# Patient Record
Sex: Male | Born: 1960 | Race: Black or African American | Hispanic: No | Marital: Married | Smoking: Never smoker
Health system: Southern US, Community
[De-identification: ages and names within clinical notes are randomized; demographics above are authoritative.]

---

## 2007-10-05 ENCOUNTER — Emergency Department (HOSPITAL_COMMUNITY): Admission: EM | Admit: 2007-10-05 | Discharge: 2007-10-05 | Payer: Self-pay | Admitting: Emergency Medicine

## 2016-03-14 ENCOUNTER — Emergency Department
Admission: EM | Admit: 2016-03-14 | Discharge: 2016-03-14 | Disposition: A | Discharge status: Home / Self Care | Payer: Self-pay | Attending: Emergency Medicine | Admitting: Emergency Medicine

## 2016-03-14 ENCOUNTER — Encounter: Payer: Self-pay | Admitting: Emergency Medicine

## 2016-03-14 ENCOUNTER — Emergency Department: Payer: Self-pay

## 2016-03-14 DIAGNOSIS — M1712 Unilateral primary osteoarthritis, left knee: Secondary | ICD-10-CM | POA: Insufficient documentation

## 2016-03-14 MED ORDER — MELOXICAM 15 MG PO TABS: 15.0000 mg | ORAL_TABLET | Freq: Every day | ORAL | 0 refills | Status: AC

## 2016-03-14 NOTE — ED Notes (Signed)
See triage note  Developed left knee pain about 2 months ago   Unsure of injury but does carry heavy items daily   No deformity noted positive swelling noted   Ambulates with slight limp

## 2016-03-14 NOTE — Discharge Instructions (Signed)
Been taking meloxicam once a day every day with food. Follow-up with Dr. Martha ClanKrasinski if any continued knee pain.

## 2016-03-14 NOTE — ED Provider Notes (Signed)
[facility] Emergency Department Provider Note  ____________________________________________   First MD Initiated Contact with Patient 03/14/16 1146     (approximate)  I have reviewed the triage vital signs and the nursing notes.   HISTORY  Chief Complaint Knee Pain   HPI Scott Warner is a 56 y.o. male presents to the emergency room with complaint of left knee pain for approximately 2 months. Patient denies any injury to his knee. He states that often and he has had some swelling to his left knee and occasionally becomes very painful. He has occasionally taken over-the-counter medication with some relief. 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 This is first time he has been seen for his knee. Currently he rates his pain as a 4 out of 10.   History reviewed. No pertinent past medical history.  There are no active problems to display for this patient.   History reviewed. No pertinent surgical history.  Prior to Admission medications   Medication Sig Start Date End Date Taking? Authorizing Provider  meloxicam (MOBIC) 15 MG tablet Take 1 tablet (15 mg total) by mouth daily. 03/14/16 03/14/17  Tommi Rumpshonda L Teyona Nichelson, PA-C    Allergies Patient has no known allergies.  No family history on file.  Social History Social History  Substance Use Topics  . Smoking status: Never Smoker  . Smokeless tobacco: Never Used  . Alcohol use Yes     Comment: rarely    Review of Systems Constitutional: No fever/chills Cardiovascular: Denies chest pain. Respiratory: Denies shortness of breath. Gastrointestinal: No abdominal pain.  No nausea, no vomiting.  Musculoskeletal: Positive left knee pain. Skin: Negative for rash. Neurological: Negative for headaches, focal weakness or numbness.  10-point ROS otherwise negative.  ____________________________________________   PHYSICAL EXAM:  VITAL  SIGNS: ED Triage Vitals  Enc Vitals Group     BP 03/14/16 1127 135/82     Pulse Rate 03/14/16 1127 75     Resp 03/14/16 1127 18     Temp 03/14/16 1127 98.4 F (36.9 C)     Temp Source 03/14/16 1127 Oral     SpO2 03/14/16 1127 98 %     Weight 03/14/16 1128 300 lb (136.1 kg)     Height 03/14/16 1128 6\' 4"  (1.93 m)     Head Circumference --      Peak Flow --      Pain Score 03/14/16 1128 4     Pain Loc --      Pain Edu? --      Excl. in GC? --     Constitutional: Alert and oriented. Well appearing and in no acute distress. Eyes: Conjunctivae are normal. PERRL. EOMI. Head: Atraumatic. Nose: No congestion/rhinnorhea. Neck: No stridor.   Cardiovascular: Normal rate, regular rhythm. Grossly normal heart sounds.  Good peripheral circulation. Respiratory: Normal respiratory effort.  No retractions. Lungs CTAB. Musculoskeletal: On examination of the left knee there is no gross deformity noted. There is crepitus with range of motion. There is no effusion present. There is no warmth or redness noted. Patient is able to ambulate without assistance. He moves are stable. Neurologic:  Normal speech and language. No gross focal neurologic deficits are appreciated. No gait instability. Skin:  Skin is warm, dry and intact. No rash noted. Psychiatric: Mood and affect are normal. Speech and behavior are normal.  ____________________________________________   LABS (all labs ordered are listed, but only  abnormal results are displayed)  Labs Reviewed - No data to display  RADIOLOGY Left knee x-ray per radiologist: IMPRESSION:  Tricompartmental degenerative changes. No acute abnormality.   I, Tommi Rumps, personally viewed and evaluated these images (plain radiographs) as part of my medical decision making, as well as reviewing the written report by the radiologist.  ____________________________________________   PROCEDURES  Procedure(s) performed: None  Procedures  Critical Care  performed: No  ____________________________________________   INITIAL IMPRESSION / ASSESSMENT AND PLAN / ED COURSE  Pertinent labs & imaging results that were available during my care of the patient were reviewed by me and considered in my medical decision making (see chart for details).    Clinical Course    X-ray findings were discussed with patient and he was given a copy of 1 x-ray to help explain his knee problems to him. He was given a prescription for meloxicam 15 mg one daily with food. Patient is follow-up with Dr. Martha Clan if any continued problems with his knee or continued concerns.  ____________________________________________   FINAL CLINICAL IMPRESSION(S) / ED DIAGNOSES  Final diagnoses:  Osteoarthritis of left knee, unspecified osteoarthritis type      NEW MEDICATIONS STARTED DURING THIS VISIT:  Discharge Medication List as of 03/14/2016 12:43 PM    START taking these medications   Details  meloxicam (MOBIC) 15 MG tablet Take 1 tablet (15 mg total) by mouth daily., Starting Sat 03/14/2016, Until Sun 03/14/2017, Print         Note:  This document was prepared using Dragon voice recognition software and may include unintentional dictation errors.    Tommi Rumps, PA-C 03/14/16 1551    Jeanmarie Plant, MD 03/16/16 [phone]

## 2016-03-14 NOTE — ED Triage Notes (Signed)
Patient presents to the ED with left knee pain x 2 months with swelling to left knee as well.  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

## 2019-05-11 ENCOUNTER — Emergency Department (HOSPITAL_COMMUNITY): Payer: HRSA Program

## 2019-05-11 ENCOUNTER — Encounter (HOSPITAL_COMMUNITY): Payer: Self-pay | Admitting: Pediatrics

## 2019-05-11 ENCOUNTER — Emergency Department (HOSPITAL_COMMUNITY)
Admission: EM | Admit: 2019-05-11 | Discharge: 2019-05-12 | Disposition: A | Discharge status: Home / Self Care | Payer: HRSA Program | Attending: Emergency Medicine | Admitting: Emergency Medicine

## 2019-05-11 ENCOUNTER — Other Ambulatory Visit: Payer: Self-pay

## 2019-05-11 DIAGNOSIS — J181 Lobar pneumonia, unspecified organism: Secondary | ICD-10-CM | POA: Diagnosis not present

## 2019-05-11 DIAGNOSIS — Z20822 Contact with and (suspected) exposure to covid-19: Secondary | ICD-10-CM

## 2019-05-11 DIAGNOSIS — U071 COVID-19: Secondary | ICD-10-CM | POA: Diagnosis not present

## 2019-05-11 DIAGNOSIS — R197 Diarrhea, unspecified: Secondary | ICD-10-CM

## 2019-05-11 DIAGNOSIS — J189 Pneumonia, unspecified organism: Secondary | ICD-10-CM

## 2019-05-11 LAB — POC SARS CORONAVIRUS 2 AG -  ED: SARS Coronavirus 2 Ag: NEGATIVE

## 2019-05-11 LAB — CBC
HCT: 43.4 % (ref 39.0–52.0)
Hemoglobin: 15.5 g/dL (ref 13.0–17.0)
MCH: 29.1 pg (ref 26.0–34.0)
MCHC: 35.7 g/dL (ref 30.0–36.0)
MCV: 81.4 fL (ref 80.0–100.0)
Platelets: 239 10*3/uL (ref 150–400)
RBC: 5.33 MIL/uL (ref 4.22–5.81)
RDW: 13.8 % (ref 11.5–15.5)
WBC: 6.4 10*3/uL (ref 4.0–10.5)
nRBC: 0 % (ref 0.0–0.2)

## 2019-05-11 LAB — COMPREHENSIVE METABOLIC PANEL
ALT: 30 U/L (ref 0–44)
AST: 43 U/L — ABNORMAL HIGH (ref 15–41)
Albumin: 3.5 g/dL (ref 3.5–5.0)
Alkaline Phosphatase: 67 U/L (ref 38–126)
Anion gap: 13 (ref 5–15)
BUN: 19 mg/dL (ref 6–20)
CO2: 20 mmol/L — ABNORMAL LOW (ref 22–32)
Calcium: 8.9 mg/dL (ref 8.9–10.3)
Chloride: 103 mmol/L (ref 98–111)
Creatinine, Ser: 1.41 mg/dL — ABNORMAL HIGH (ref 0.61–1.24)
GFR calc Af Amer: >60 mL/min (ref >60)
GFR calc non Af Amer: 54 mL/min — ABNORMAL LOW (ref >60)
Glucose, Bld: 108 mg/dL — ABNORMAL HIGH (ref 70–99)
Potassium: 4.2 mmol/L (ref 3.5–5.1)
Sodium: 136 mmol/L (ref 135–145)
Total Bilirubin: 1.5 mg/dL — ABNORMAL HIGH (ref 0.3–1.2)
Total Protein: 7.7 g/dL (ref 6.5–8.1)

## 2019-05-11 LAB — URINALYSIS, ROUTINE W REFLEX MICROSCOPIC
Bilirubin Urine: NEGATIVE
Glucose, UA: NEGATIVE mg/dL
Hgb urine dipstick: NEGATIVE
Ketones, ur: NEGATIVE mg/dL
Leukocytes,Ua: NEGATIVE
Nitrite: NEGATIVE
Protein, ur: 30 mg/dL — AB
Specific Gravity, Urine: 1.026 (ref 1.005–1.030)
pH: 6 (ref 5.0–8.0)

## 2019-05-11 LAB — LIPASE, BLOOD: Lipase: 31 U/L (ref 11–51)

## 2019-05-11 MED ORDER — SODIUM CHLORIDE 0.9 % IV BOLUS
1000.0000 mL | Freq: Once | INTRAVENOUS | Status: AC
  Administered 2019-05-11: 23:00:00 1000 mL via INTRAVENOUS

## 2019-05-11 MED ORDER — DOXYCYCLINE HYCLATE 100 MG PO CAPS: 100.0000 mg | ORAL_CAPSULE | Freq: Two times a day (BID) | ORAL | 0 refills | Status: AC

## 2019-05-11 MED ORDER — SODIUM CHLORIDE 0.9% FLUSH
3.0000 mL | Freq: Once | INTRAVENOUS | Status: AC
  Administered 2019-05-11: 23:00:00 3 mL via INTRAVENOUS

## 2019-05-11 MED ORDER — ONDANSETRON 4 MG PO TBDP: 4.0000 mg | ORAL_TABLET | Freq: Three times a day (TID) | ORAL | 0 refills | Status: AC | PRN

## 2019-05-11 MED ORDER — ACETAMINOPHEN 325 MG PO TABS
650.0000 mg | ORAL_TABLET | Freq: Once | ORAL | Status: AC
  Administered 2019-05-11: 19:00:00 650 mg via ORAL
  Filled 2019-05-11: qty 2

## 2019-05-11 NOTE — Discharge Instructions (Addendum)
Your rapid Covid test was negative.  However we will need to get another test to verify these results. You do have evidence of pneumonia on your chest x-ray. We will treat this with an antibiotic. Take the Zofran to help with nausea. Follow-up with your primary care provider. Return to the ER if you start to experience chest pain, shortness of breath, increased abdominal pain, bloody stools.

## 2019-05-11 NOTE — ED Notes (Signed)
Ester Mabe son [phone] call with updates

## 2019-05-11 NOTE — ED Provider Notes (Signed)
[facility] EMERGENCY DEPARTMENT Provider Note   CSN: [id number] Arrival date & time: 05/11/19  1849     History Chief Complaint  Patient presents with  . Diarrhea    Scott Warner is a 59 y.o. male who presents to ED with a chief complaint of diarrhea.  Since 05/08/2019 has been having 2-3 episodes of watery diarrhea daily.  Denies any bloody diarrhea.  Has been noticing chills and a slight productive cough with clear mucus as well.  No sick contacts with similar symptoms.  Denies any suspicious food ingestions.  Has been able to tolerate liquids without vomiting.  Reports generalized cramping abdominal pain from the diarrhea but denies any focal pain.  Denies any urinary symptoms, has not been taking any medications to help with his symptoms.  Denies any shortness of breath, cough, back pain, hemoptysis, leg swelling.  HPI     History reviewed. No pertinent past medical history.  There are no problems to display for this patient.   History reviewed. No pertinent surgical history.     No family history on file.  Social History   Tobacco Use  . Smoking status: Never Smoker  . Smokeless tobacco: Never Used  Substance Use Topics  . Alcohol use: Yes    Comment: rarely  . Drug use: Not on file    Home Medications Prior to Admission medications   Medication Sig Start Date End Date Taking? Authorizing Provider  doxycycline (VIBRAMYCIN) 100 MG capsule Take 1 capsule (100 mg total) by mouth 2 (two) times daily. 05/11/19   Alesi Zachery, PA-C  ondansetron (ZOFRAN ODT) 4 MG disintegrating tablet Take 1 tablet (4 mg total) by mouth every 8 (eight) hours as needed for nausea or vomiting. 05/11/19   Dietrich Pates, PA-C    Allergies    Patient has no known allergies.  Review of Systems   Review of Systems  Constitutional: Positive for chills. Negative for appetite change and fever.  HENT: Negative for ear pain, rhinorrhea, sneezing and sore throat.   Eyes:  Negative for photophobia and visual disturbance.  Respiratory: Positive for cough. Negative for chest tightness, shortness of breath and wheezing.   Cardiovascular: Negative for chest pain and palpitations.  Gastrointestinal: Positive for diarrhea. Negative for abdominal pain, blood in stool, constipation, nausea and vomiting.  Genitourinary: Negative for dysuria, hematuria and urgency.  Musculoskeletal: Negative for myalgias.  Skin: Negative for rash.  Neurological: Negative for dizziness, weakness and light-headedness.    Physical Exam Updated Vital Signs BP (!) 147/75   Pulse 80   Temp 99.1 F (37.3 C) (Oral)   Resp 16   Ht 6\' 4"  (1.93 m)   Wt 124.7 kg   SpO2 92%   BMI 33.47 kg/m   Physical Exam Vitals and nursing note reviewed.  Constitutional:      General: He is not in acute distress.    Appearance: He is well-developed.  HENT:     Head: Normocephalic and atraumatic.     Nose: Nose normal.  Eyes:     General: No scleral icterus.       Right eye: No discharge.        Left eye: No discharge.     Conjunctiva/sclera: Conjunctivae normal.  Cardiovascular:     Rate and Rhythm: Normal rate and regular rhythm.     Heart sounds: Normal heart sounds. No murmur. No friction rub. No gallop.   Pulmonary:     Effort: Pulmonary effort is normal. No  respiratory distress.     Breath sounds: Normal breath sounds.  Abdominal:     General: Bowel sounds are normal. There is no distension.     Palpations: Abdomen is soft.     Tenderness: There is no abdominal tenderness. There is no guarding.     Comments: Nontender.  Musculoskeletal:        General: Normal range of motion.     Cervical back: Normal range of motion and neck supple.  Skin:    General: Skin is warm and dry.     Findings: No rash.  Neurological:     Mental Status: He is alert.     Motor: No abnormal muscle tone.     Coordination: Coordination normal.     ED Results / Procedures / Treatments   Labs (all labs  ordered are listed, but only abnormal results are displayed) Labs Reviewed  COMPREHENSIVE METABOLIC PANEL - Abnormal; Notable for the following components:      Result Value   CO2 20 (*)    Glucose, Bld 108 (*)    Creatinine, Ser 1.41 (*)    AST 43 (*)    Total Bilirubin 1.5 (*)    GFR calc non Af Amer 54 (*)    All other components within normal limits  URINALYSIS, ROUTINE W REFLEX MICROSCOPIC - Abnormal; Notable for the following components:   Color, Urine AMBER (*)    Protein, ur 30 (*)    Bacteria, UA RARE (*)    All other components within normal limits  SARS CORONAVIRUS 2 (TAT 6-24 HRS)  LIPASE, BLOOD  CBC  POC SARS CORONAVIRUS 2 AG -  ED    EKG None  Radiology DG Chest Portable 1 View  Result Date: 05/11/2019 CLINICAL DATA:  Fever and cough EXAM: PORTABLE CHEST 1 VIEW COMPARISON:  None. FINDINGS: Hazy opacity right base. No pleural effusion. Normal heart size. No pneumothorax IMPRESSION: Hazy right basilar atelectasis or mild pneumonia. Electronically Signed   By: Donavan Foil M.D.   On: 05/11/2019 22:53    Procedures Procedures (including critical care time)  Medications Ordered in ED Medications  sodium chloride flush (NS) 0.9 % injection 3 mL (3 mLs Intravenous Given 05/11/19 2251)  acetaminophen (TYLENOL) tablet 650 mg (650 mg Oral Given 05/11/19 1906)  sodium chloride 0.9 % bolus 1,000 mL (1,000 mLs Intravenous New Bag/Given 05/11/19 2251)    ED Course  I have reviewed the triage vital signs and the nursing notes.  Pertinent labs & imaging results that were available during my care of the patient were reviewed by me and considered in my medical decision making (see chart for details).  Clinical Course as of May 11 2346  Thu May 11, 2019  2241 Recheck temperature is 101.1 F.   [HK]  2241 Creatinine(!): 1.41 [HK]  2332 Temp: 99.1 F (37.3 C) [HK]    Clinical Course User Index [HK] Delia Heady, PA-C   MDM Rules/Calculators/A&P                       Scott Warner was evaluated in Emergency Department on 05/11/19 for the symptoms described in the history of present illness. He/she was evaluated in the context of the global COVID-19 pandemic, which necessitated consideration that the patient might be at risk for infection with the SARS-CoV-2 virus that causes COVID-19. Institutional protocols and algorithms that pertain to the evaluation of patients at risk for COVID-19 are in a state of rapid change  based on information released by regulatory bodies including the CDC and federal and state organizations. These policies and algorithms were followed during the patient's care in the ED.  59 year old male presenting to the ED with a chief complaint of diarrhea.  Patient found to be febrile to 103 here.  Reports diarrhea for the past 4 days as well as a productive cough.  No known COVID-19 exposures or sick contacts with similar symptoms.  Denies any abdominal pain but does report generalized cramping sensation with having diarrhea.  On exam abdomen is soft, nontender nondistended.  Temperature improved with Tylenol given in triage.  He is not tachycardic, tachypneic, oxygen saturations above 92 to 93% on room air.  He does not appear to be in respiratory distress.  Work-up here significant for creatinine of 1.4, no priors.  CBC unremarkable, normal white blood cell count.  Lipase is unremarkable.  Chest x-ray shows possible right-sided pneumonia.  POC COVID test negative.  Will obtain PCR test.  Discussed with patient regarding his lab work and vital signs today.  We will treat him for CAP and will give Zofran to help with symptoms.  Patient is comfortable with discharge home and he does not feel that he needs admission at this time.  I did ask him to increase his p.o. hydration to help with his symptoms as well.   Feel that his fever today could be secondary to either Covid, his pneumonia or his viral GI illness, I doubt appendicitis, cholecystitis or other  emergent cause.  Patient given strict return precautions.  Patient is hemodynamically stable, in NAD, and able to ambulate in the ED. Evaluation does not show pathology that would require ongoing emergent intervention or inpatient treatment. I have personally reviewed and interpreted all lab work and imaging at today's ED visit. I explained the diagnosis to the patient. Pain has been managed and has no complaints prior to discharge. Patient is comfortable with above plan and is stable for discharge at this time. All questions were answered prior to disposition. Strict return precautions for returning to the ED were discussed. Encouraged follow up with PCP.   An After Visit Summary was printed and given to the patient.   Portions of this note were generated with Scientist, clinical (histocompatibility and immunogenetics). Dictation errors may occur despite best attempts at proofreading.   Final Clinical Impression(s) / ED Diagnoses Final diagnoses:  Diarrhea, unspecified type  Suspected COVID-19 virus infection  Community acquired pneumonia of right lung, unspecified part of lung    Rx / DC Orders ED Discharge Orders         Ordered    doxycycline (VIBRAMYCIN) 100 MG capsule  2 times daily     05/11/19 2343    ondansetron (ZOFRAN ODT) 4 MG disintegrating tablet  Every 8 hours PRN     05/11/19 2343           Dietrich Pates, PA-C 05/11/19 2348    Milagros Loll, MD 05/16/19 2356

## 2019-05-11 NOTE — ED Triage Notes (Signed)
Came in via EMS from home; c/o diarrhea since Monday.

## 2019-05-12 ENCOUNTER — Telehealth (HOSPITAL_COMMUNITY): Payer: Self-pay

## 2019-05-12 LAB — SARS CORONAVIRUS 2 (TAT 6-24 HRS): SARS Coronavirus 2: POSITIVE — AB

## 2019-05-12 NOTE — ED Notes (Signed)
Patient verbalizes understanding of discharge instructions. Opportunity for questioning and answers were provided. Armband removed by staff, pt discharged from ED in wheelchair to home.   

## 2019-05-12 NOTE — ED Notes (Signed)
This RN called a cab for this pt

## 2020-09-12 ENCOUNTER — Emergency Department (HOSPITAL_COMMUNITY): Payer: No Typology Code available for payment source

## 2020-09-12 ENCOUNTER — Emergency Department (HOSPITAL_COMMUNITY)
Admission: EM | Admit: 2020-09-12 | Discharge: 2020-09-12 | Disposition: A | Discharge status: Home / Self Care | Payer: No Typology Code available for payment source | Attending: Emergency Medicine | Admitting: Emergency Medicine

## 2020-09-12 ENCOUNTER — Encounter (HOSPITAL_COMMUNITY): Payer: Self-pay | Admitting: *Deleted

## 2020-09-12 ENCOUNTER — Other Ambulatory Visit: Payer: Self-pay

## 2020-09-12 DIAGNOSIS — R42 Dizziness and giddiness: Secondary | ICD-10-CM | POA: Insufficient documentation

## 2020-09-12 DIAGNOSIS — M25832 Other specified joint disorders, left wrist: Secondary | ICD-10-CM

## 2020-09-12 DIAGNOSIS — M25512 Pain in left shoulder: Secondary | ICD-10-CM | POA: Diagnosis not present

## 2020-09-12 DIAGNOSIS — R519 Headache, unspecified: Secondary | ICD-10-CM | POA: Diagnosis not present

## 2020-09-12 DIAGNOSIS — Y92410 Unspecified street and highway as the place of occurrence of the external cause: Secondary | ICD-10-CM | POA: Diagnosis not present

## 2020-09-12 DIAGNOSIS — M1990 Unspecified osteoarthritis, unspecified site: Secondary | ICD-10-CM | POA: Diagnosis not present

## 2020-09-12 DIAGNOSIS — M24832 Other specific joint derangements of left wrist, not elsewhere classified: Secondary | ICD-10-CM | POA: Diagnosis not present

## 2020-09-12 DIAGNOSIS — M25562 Pain in left knee: Secondary | ICD-10-CM | POA: Insufficient documentation

## 2020-09-12 DIAGNOSIS — M7918 Myalgia, other site: Secondary | ICD-10-CM

## 2020-09-12 DIAGNOSIS — M25561 Pain in right knee: Secondary | ICD-10-CM | POA: Diagnosis not present

## 2020-09-12 DIAGNOSIS — M25532 Pain in left wrist: Secondary | ICD-10-CM | POA: Diagnosis present

## 2020-09-12 DIAGNOSIS — M545 Low back pain, unspecified: Secondary | ICD-10-CM | POA: Insufficient documentation

## 2020-09-12 MED ORDER — NAPROXEN 500 MG PO TABS: 500.0000 mg | ORAL_TABLET | Freq: Two times a day (BID) | ORAL | 0 refills | Status: AC

## 2020-09-12 MED ORDER — METHOCARBAMOL 500 MG PO TABS: 500.0000 mg | ORAL_TABLET | Freq: Two times a day (BID) | ORAL | 0 refills | Status: AC
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
## 2020-09-12 NOTE — Discharge Instructions (Addendum)
You may alternate taking Tylenol and Naproxen as needed for pain control. You may take Naproxen twice daily as directed on your discharge paperwork and you may take  [phone] mg of Tylenol every 6 hours. Do not exceed 4000 mg of Tylenol daily as this can lead to liver damage. Also, make sure to take Naproxen with meals as it can cause an upset stomach. Do not take other NSAIDs while taking Naproxen such as (Aleve, Ibuprofen, Aspirin, Celebrex, etc) and do not take more than the prescribed dose as this can lead to ulcers and bleeding in your GI tract. You may use warm and cold compresses to help with your symptoms.   You were given a prescription for Robaxin which is a muscle relaxer.  You should not drive, work, or operate machinery while taking this medication as it can make you very drowsy.    Please follow up with Dr Romeo Apple within the next 7-10 days for re-evaluation and further treatment of your symptoms.   Please return to the ER sooner if you have any new or worsening symptoms.
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
